# Patient Record
Sex: Male | Born: 2000 | Hispanic: No | Marital: Single | Smoking: Never smoker
Health system: Southern US, Community
[De-identification: ages and names within clinical notes are randomized; demographics above are authoritative.]

## PROBLEM LIST (undated history)

## (undated) DIAGNOSIS — T7840XA Allergy, unspecified, initial encounter: Secondary | ICD-10-CM

## (undated) HISTORY — DX: Allergy, unspecified, initial encounter: T78.40XA

---
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
## 2019-02-21 ENCOUNTER — Emergency Department (HOSPITAL_COMMUNITY)
Admission: EM | Admit: 2019-02-21 | Discharge: 2019-02-21 | Disposition: A | Discharge status: Home / Self Care | Payer: Medicaid Other | Attending: Emergency Medicine | Admitting: Emergency Medicine

## 2019-02-21 ENCOUNTER — Emergency Department (HOSPITAL_COMMUNITY): Payer: Medicaid Other

## 2019-02-21 ENCOUNTER — Other Ambulatory Visit: Payer: Self-pay

## 2019-02-21 ENCOUNTER — Encounter (HOSPITAL_COMMUNITY): Payer: Self-pay | Admitting: *Deleted

## 2019-02-21 DIAGNOSIS — W108XXA Fall (on) (from) other stairs and steps, initial encounter: Secondary | ICD-10-CM | POA: Insufficient documentation

## 2019-02-21 DIAGNOSIS — S4992XA Unspecified injury of left shoulder and upper arm, initial encounter: Secondary | ICD-10-CM | POA: Diagnosis present

## 2019-02-21 DIAGNOSIS — Y929 Unspecified place or not applicable: Secondary | ICD-10-CM | POA: Insufficient documentation

## 2019-02-21 DIAGNOSIS — S43005A Unspecified dislocation of left shoulder joint, initial encounter: Secondary | ICD-10-CM | POA: Diagnosis not present

## 2019-02-21 DIAGNOSIS — Z79899 Other long term (current) drug therapy: Secondary | ICD-10-CM | POA: Insufficient documentation

## 2019-02-21 DIAGNOSIS — Y9389 Activity, other specified: Secondary | ICD-10-CM | POA: Insufficient documentation

## 2019-02-21 DIAGNOSIS — Y999 Unspecified external cause status: Secondary | ICD-10-CM | POA: Diagnosis not present

## 2019-02-21 MED ORDER — KETAMINE HCL 10 MG/ML IJ SOLN
INTRAMUSCULAR | Status: AC | PRN
  Administered 2019-02-21: 50 mg via INTRAVENOUS

## 2019-02-21 MED ORDER — FENTANYL CITRATE (PF) 100 MCG/2ML IJ SOLN
50.0000 ug | Freq: Once | INTRAMUSCULAR | Status: AC
  Administered 2019-02-21: 50 ug via INTRAVENOUS
  Filled 2019-02-21: qty 2

## 2019-02-21 MED ORDER — KETOROLAC TROMETHAMINE 30 MG/ML IJ SOLN
30.0000 mg | Freq: Once | INTRAMUSCULAR | Status: AC
  Administered 2019-02-21: 30 mg via INTRAVENOUS
  Filled 2019-02-21: qty 1

## 2019-02-21 MED ORDER — PROPOFOL 10 MG/ML IV BOLUS
INTRAVENOUS | Status: AC
  Filled 2019-02-21: qty 20

## 2019-02-21 MED ORDER — PROPOFOL 10 MG/ML IV BOLUS
INTRAVENOUS | Status: AC | PRN
  Administered 2019-02-21: 50 mg via INTRAVENOUS

## 2019-02-21 MED ORDER — KETAMINE HCL 50 MG/5ML IJ SOSY
PREFILLED_SYRINGE | INTRAMUSCULAR | Status: AC
  Filled 2019-02-21: qty 5

## 2019-02-21 MED ORDER — OXYCODONE-ACETAMINOPHEN 5-325 MG PO TABS: 2.0000 | ORAL_TABLET | ORAL | 0 refills | Status: AC | PRN

## 2019-02-21 MED ORDER — ETOMIDATE 2 MG/ML IV SOLN
0.1500 mg/kg | Freq: Once | INTRAVENOUS | Status: AC
  Administered 2019-02-21: 15:00:00 12.94 mg via INTRAVENOUS
  Filled 2019-02-21: qty 10

## 2019-02-21 NOTE — ED Notes (Signed)
Patient transported to X-ray 

## 2019-02-21 NOTE — ED Triage Notes (Signed)
Pt reports falling down several stairs this am and now has left shoulder and arm pain, decreased rom.

## 2019-02-21 NOTE — ED Provider Notes (Signed)
[facility] Provider Note   CSN: [id number] Arrival date & time: 02/21/19  1203    History   Chief Complaint Chief Complaint  Patient presents with  . Fall  . Shoulder Pain    HPI Alexander Blankenship is a 18 y.o. male who presents to the ED complaining of sudden onset, constant, severe, 10/10, sharp, left shoulder pain that began around 8 AM this morning. Pt states that he was walking down the stairs when he tripped and attempted to catch himself by the railing. Pt grabbed onto the railing with his left hand but was still falling down the stairs when he felt a pop and had sudden pain in the shoulder. He has not taken anything for the pain; was attempting to deal with the pain on his own but reports it was so severe that he decided to come to the ED. Pt states he is unable to lift his arm at all due to the pain. Denies paresthesias, weakness, numbness.       Past Medical History:  Diagnosis Date  . Allergy     Patient Active Problem List   Diagnosis Date Noted  . Perianal abscess 09/11/2018  . Acne vulgaris 10/28/2014  . 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 OVERWEIGHT 09/25/2008  . DECREASED HEARING 09/26/2007  . ALLERGIC RHINITIS 11/05/2006  . 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 INCISION AND DRAINAGE ABSCESS N/A 09/11/2018   Procedure: INCISION AND DRAINAGE PERIANAL ABSCESS;  Surgeon: Andria MeuseWhite, Christopher M, MD;  Location: [facility] OR;  Service: General;  Laterality: N/A;        Home Medications    Prior to Admission medications   Medication Sig Start Date End Date Taking? Authorizing Provider  busPIRone (BUSPAR) 10 MG tablet Take 10 mg by mouth 2 (two) times a day. 01/01/19 04/01/19 Yes [provider]  FLUoxetine (PROZAC) 20 MG capsule Take 40 mg by mouth daily. 01/01/19  Yes [provider]  oxyCODONE-acetaminophen (PERCOCET/ROXICET) 5-325 MG tablet Take 2 tablets by  mouth every 4 (four) hours as needed for severe pain. 02/21/19   Tanda RockersVenter, Jazmin Ley, PA-C    Family History History reviewed. No pertinent family history.  Social History Social History   Tobacco Use  . Smoking status: Never Smoker  . Smokeless tobacco: Never Used  Substance Use Topics  . Alcohol use: No  . Drug use: No     Allergies   Patient has no known allergies.   Review of Systems Review of Systems  Constitutional: Negative for chills and fever.  HENT: Negative for congestion.   Eyes: Negative for visual disturbance.  Respiratory: Negative for cough.   Cardiovascular: Negative for chest pain.  Gastrointestinal: Negative for abdominal pain.  Genitourinary: Negative for difficulty urinating.  Musculoskeletal: Positive for arthralgias.  Skin: Negative for rash.  Neurological: Negative for weakness and numbness.     Physical Exam Updated Vital Signs BP (!) 152/84 (BP Location: Right Arm)   Pulse 100   Temp 98.3 F (36.8 C) (Oral)   Resp 18   SpO2 100%   Physical Exam Vitals signs and nursing note reviewed.  Constitutional:      Appearance: He is not ill-appearing.  HENT:     Head: Normocephalic and atraumatic.  Eyes:     Conjunctiva/sclera: Conjunctivae normal.  Neck:     Musculoskeletal: Neck supple.  Cardiovascular:     Rate and Rhythm: Normal rate and regular rhythm.  Pulmonary:  Effort: Pulmonary effort is normal.     Breath sounds: Normal breath sounds.  Abdominal:     Palpations: Abdomen is soft.     Tenderness: There is no abdominal tenderness.  Musculoskeletal:     Comments: Obvious deformity to left shoulder with exquisite TTP; mild tenderness to left humerus and left forearm although pt reports radiating pain from the shoulder; 2+ radial pulse; able to wiggle fingers; grip strength 4/5  Skin:    General: Skin is warm and dry.  Neurological:     Mental Status: He is alert.      ED Treatments / Results  Labs (all labs ordered are  listed, but only abnormal results are displayed) Labs Reviewed - No data to display  EKG None  Radiology Dg Shoulder Left  Result Date: 02/21/2019 CLINICAL DATA:  Left shoulder injury due to fall down stairs today. Initial encounter. EXAM: LEFT SHOULDER - 2+ VIEW COMPARISON:  None. FINDINGS: The left shoulder is anteriorly dislocated. Fracture. The acromioclavicular joint appears normal. Imaged left lung ribs appear normal. IMPRESSION: Anterior left shoulder dislocation. Electronically Signed   By: Inge Rise M.D.   On: 02/21/2019 12:56   Dg Shoulder Left Portable  Result Date: 02/21/2019 CLINICAL DATA:  Status post reduction. EXAM: LEFT SHOULDER - 1 VIEW COMPARISON:  February 21, 2019 FINDINGS: There has been interval reduction of the previously demonstrated glenohumeral dislocation. There may be a slight irregularity of the humeral head. The inferior glenoid appears grossly unremarkable. IMPRESSION: 1. Interval reduction of the previously demonstrated glenohumeral dislocation. 2. 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 3. No definite fracture involving the inferior glenoid. Electronically Signed   By: Constance Holster M.D.   On: 02/21/2019 15:47    Procedures Procedures (including critical care time)  Medications Ordered in ED Medications  fentaNYL (SUBLIMAZE) injection 50 mcg (50 mcg Intravenous Given 02/21/19 1324)  etomidate (AMIDATE) injection 12.94 mg (12.94 mg Intravenous Given 02/21/19 1453)  propofol (DIPRIVAN) 10 mg/mL bolus/IV push (50 mg Intravenous Given 02/21/19 1505)  ketorolac (TORADOL) 30 MG/ML injection 30 mg (30 mg Intravenous Given 02/21/19 1543)  ketamine (KETALAR) injection (50 mg Intravenous Given 02/21/19 1506)     Initial Impression / Assessment and Plan / ED Course  I have reviewed the triage vital signs and the nursing notes.  Pertinent labs & imaging results that were available during my care of the patient were  reviewed by me and considered in my medical decision making (see chart for details).    18 year old male presenting to the ED with left shoulder pain after tripping and falling down stairs and attempted to hold onto the railing. Obvious deformity to left shoulder; suspect dislocation at this time. Xray obtained; per my reading the shoulder appears to be anteriorly dislocated; agree with radiologist reading. Offered reduction without conscious sedation per the [facility] Method but patient reports his pain is too severe. Will proceed with conscious sedation at this time; Attending physician Dr. Eulis Foster made aware. Fentanyl given for pain.   Conscious sedation attempted with Etomidate without success; used propofol and ketamine instead; Dr. Eulis Foster able to reduce shoulder back into place. Please see separate procedure note. Patient placed in a shoulder immobilizer; repeat xray shows appropriate reduction with Hill Sachs deformity. Will have patient follow up with orthopedics in the next 1 week. Pt given short course of pain medication as well; advised to take Ibuprofen and if no relief to take short course of Percocet prescribed. Pt  is in agreement with plan at this time and stable for discharge home.        Final Clinical Impressions(s) / ED Diagnoses   Final diagnoses:  Dislocation of left shoulder joint, initial encounter    ED Discharge Orders         Ordered    oxyCODONE-acetaminophen (PERCOCET/ROXICET) 5-325 MG tablet  Every 4 hours PRN     02/21/19 1614           Tanda RockersVenter, Darene Nappi, PA-C 02/21/19 2138    Mancel BaleWentz, Elliott, MD 02/24/19 1337

## 2019-02-21 NOTE — ED Notes (Signed)
Luiz Ochoa (Brother): [phone] Pts brother would like an update when possible.

## 2019-02-24 NOTE — ED Provider Notes (Signed)
  Face-to-face evaluation   History: Patient going down steps, and misstepped, catching himself with his left hand, and feeling a pull in his left shoulder.  No other injuries.  Physical exam: Left shoulder deformed with subacromial defect.  Neurovascular intact distally in the left hand and intact deltoid sensation.    .Sedation  Date/Time: 02/24/2019 1:21 PM Performed by: Daleen Bo, MD Authorized by: Daleen Bo, MD   Consent:    Consent obtained:  Verbal   Consent given by:  Patient   Risks discussed:  Inadequate sedation, nausea, vomiting, prolonged hypoxia resulting in organ damage and respiratory compromise necessitating ventilatory assistance and intubation Universal protocol:    Immediately prior to procedure a time out was called: yes   Indications:    Procedure performed:  Dislocation reduction   Procedure necessitating sedation performed by:  Physician performing sedation Pre-sedation assessment:    Time since last food or drink:  4 hours   ASA classification: class 1 - normal, healthy patient     Neck mobility: normal     Mouth opening:  3 or more finger widths   Mallampati score:  II - soft palate, uvula, fauces visible   Pre-sedation assessments completed and reviewed: airway patency, cardiovascular function, hydration status, mental status, pain level and respiratory function     Pre-sedation assessments completed and reviewed: nausea/vomiting not reviewed   Immediate pre-procedure details:    Reassessment: Patient reassessed immediately prior to procedure     Reviewed: vital signs     Verified: bag valve mask available, emergency equipment available, intubation equipment available, IV patency confirmed, oxygen available and suction available   Procedure details (see MAR for exact dosages):    Preoxygenation:  Room air   Sedation:  Etomidate, ketamine and propofol   Analgesia:  Fentanyl   Intra-procedure monitoring:  Blood pressure monitoring, cardiac  monitor, continuous capnometry, continuous pulse oximetry, frequent LOC assessments and frequent vital sign checks   Intra-procedure events: none     Total Provider sedation time (minutes):  25 Post-procedure details:    Attendance: Constant attendance by certified staff until patient recovered     Recovery: Patient returned to pre-procedure baseline     Post-sedation assessments completed and reviewed: airway patency, cardiovascular function, hydration status, mental status, nausea/vomiting, pain level and respiratory function     Patient is stable for discharge or admission: yes     Patient tolerance:  Tolerated well, no immediate complications .Ortho Injury Treatment  Date/Time: 02/24/2019 1:24 PM Performed by: Daleen Bo, MD Authorized by: Daleen Bo, MD   Consent:    Consent obtained:  Verbal   Risks discussed:  Vascular damage, stiffness and recurrent dislocation   Alternatives discussed:  No treatmentInjury location: shoulder Location details: left shoulder Injury type: dislocation Dislocation type: anterior Hill-Sachs deformity: yes Chronicity: new Pre-procedure neurovascular assessment: neurovascularly intact Pre-procedure distal perfusion: normal Pre-procedure neurological function: normal Pre-procedure range of motion: normal  Anesthesia: Local anesthesia used: no  Patient sedated: Yes. Refer to sedation procedure documentation for details of sedation. Manipulation performed: yes Reduction method: external rotation (Flexion and adduction) Reduction successful: yes X-ray confirmed reduction: yes Immobilization: sling Post-procedure neurovascular assessment: post-procedure neurovascularly intact Post-procedure distal perfusion: normal Post-procedure neurological function: normal Post-procedure range of motion: normal     Medical screening examination/treatment/procedure(s) were conducted as a shared visit with non-physician practitioner(s) and myself.  I  personally evaluated the patient during the encounter    Daleen Bo, MD 02/24/19 1336

## 2019-07-26 IMAGING — CT CT PELVIS W/ CM
2 of 3 series · 16 of 46 positions shown, 18 images · IV contrast (APPLIED)
Comparison: None.

CLINICAL DATA: Evaluate perirectal abscess.  Rectal pain.

EXAM:
CT PELVIS WITH CONTRAST
TECHNIQUE: Multidetector CT imaging of the pelvis was performed using the
standard protocol following the bolus administration of intravenous
contrast.
CONTRAST:  100mL OMNIPAQUE IOHEXOL 300 MG/ML  SOLN

[Series 3: pelvis 5.0 i30f 2 · axial · 0.72mm/px · z∈[+845,+1100]mm · 13 of 59 slices shown, 15 images]
[im 4/59  soft-tissue]
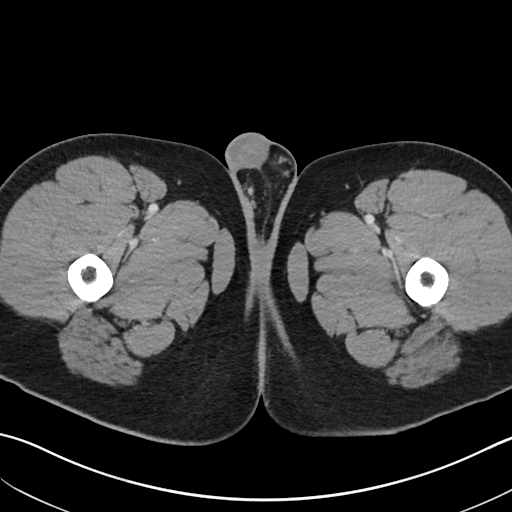
[im 4/59  bone]
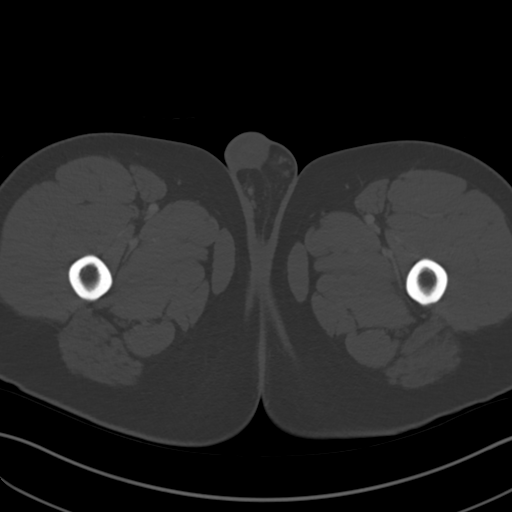
[im 8/59  soft-tissue]
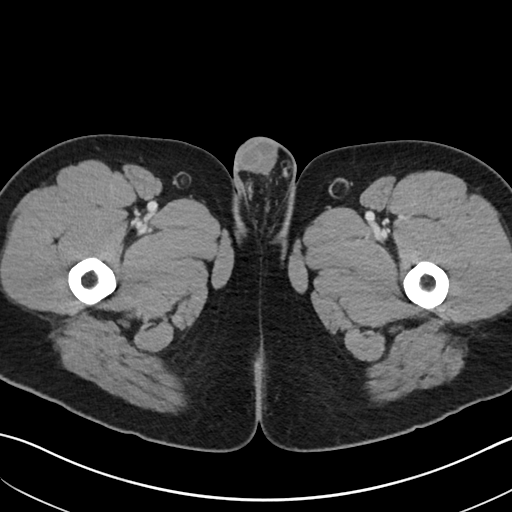
[im 12/59  soft-tissue]
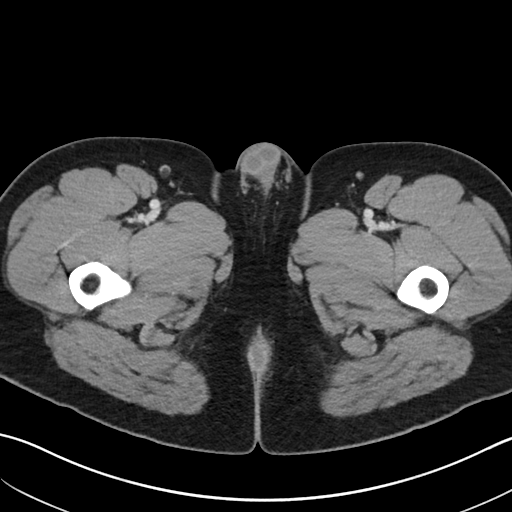
[im 17/59  soft-tissue]
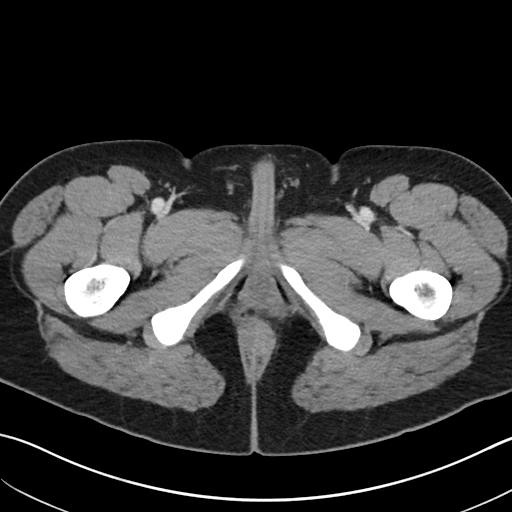
[im 21/59  soft-tissue]
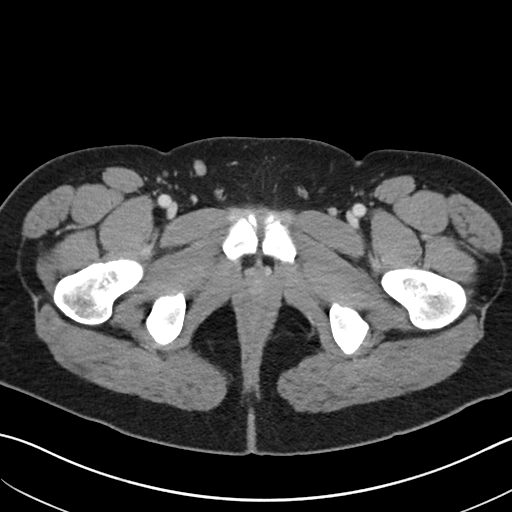
[im 25/59  soft-tissue]
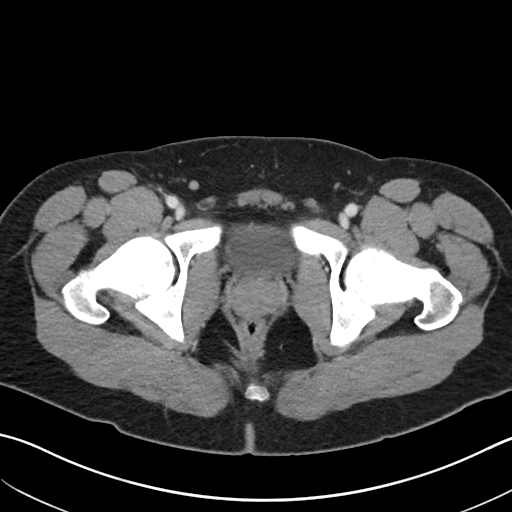
[im 30/59  soft-tissue]
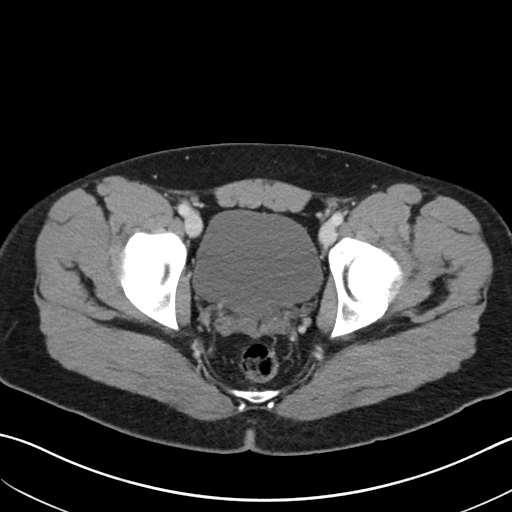
[im 34/59  soft-tissue]
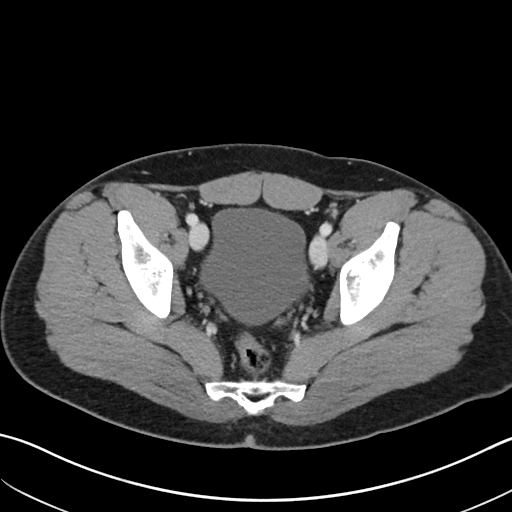
[im 38/59  soft-tissue]
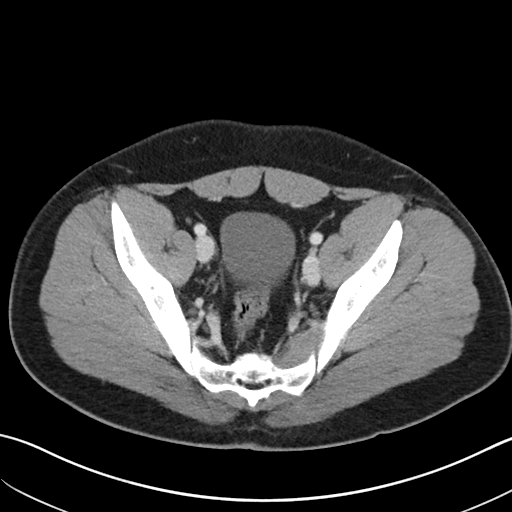
[im 38/59  bone]
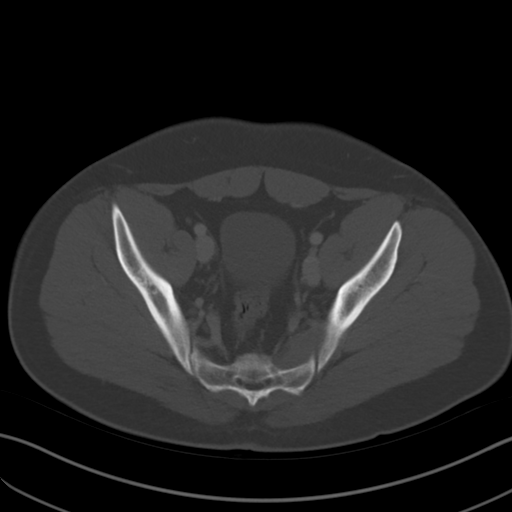
[im 42/59  soft-tissue]
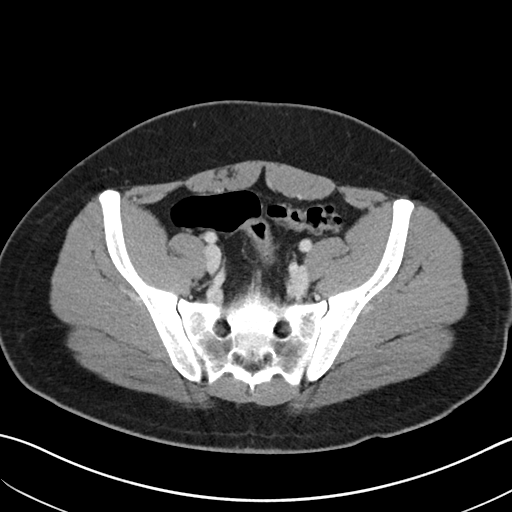
[im 47/59  soft-tissue]
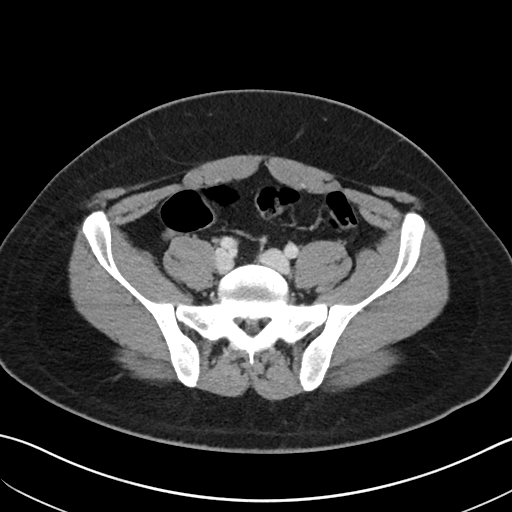
[im 51/59  soft-tissue]
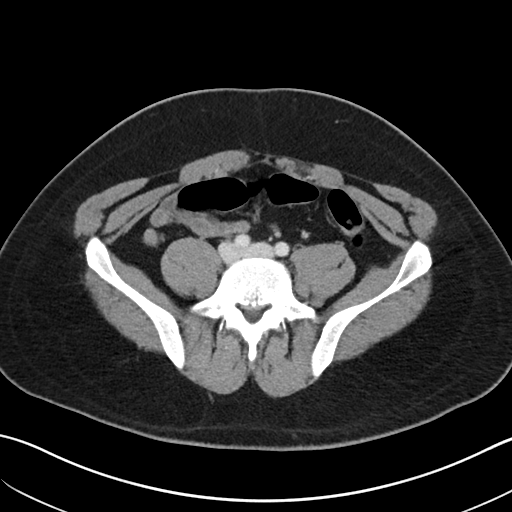
[im 55/59  soft-tissue]
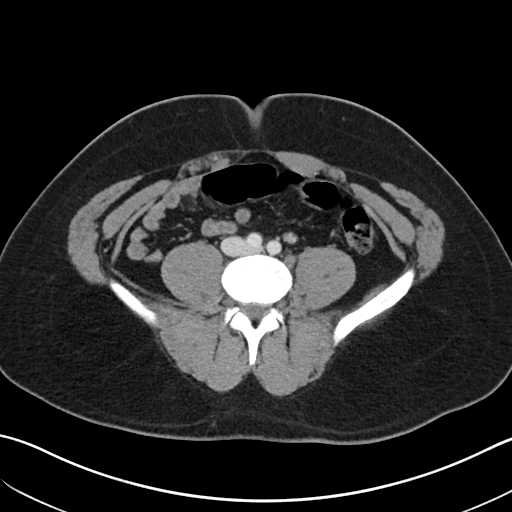

[Series 5: coronal soft tissue · coronal · 0.57mm/px · 3 of 95 slices shown]
[im 32/95  soft-tissue]
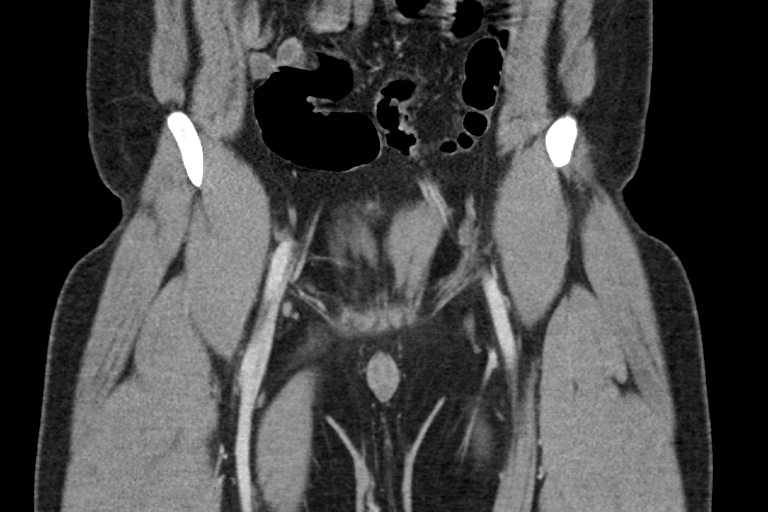
[im 42/95  soft-tissue]
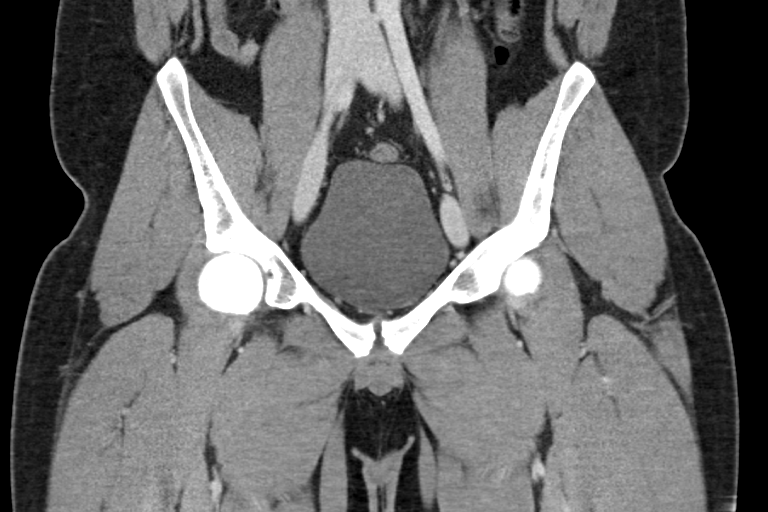
[im 53/95  soft-tissue]
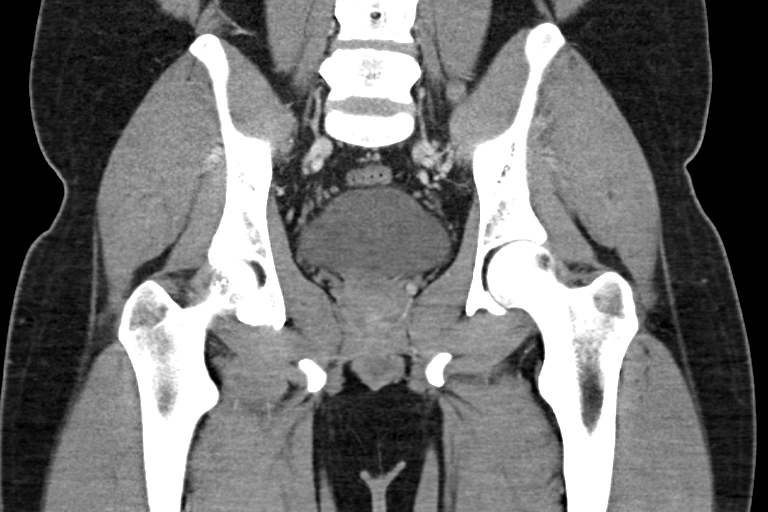

[16 of 46 positions shown; findings below may reference images not displayed]

FINDINGS: Urinary Tract: The bladder is unremarkable. No bladder mass,
asymmetric bladder wall thickening or bladder calculi.

Bowel: The visualized small bowel and colon are unremarkable. No
rectal mass or rectal inflammatory process. There is however a
perianal fistula with an abscess extending down to the perineum.
This measures approximately 3.4 x 2.1 x 1.5 cm. Recommend MR imaging
(MRI pelvis without and with contrast) for better characterization
and classification of the fistula.

Vascular/Lymphatic: The vascular structures are normal. No pelvic or
inguinal lymphadenopathy.

Reproductive:  The prostate gland and seminal vesicles are normal.

Other:  No free pelvic fluid collections or intrapelvic abscess.

Musculoskeletal: No significant bony findings.
IMPRESSION: 1. Perianal fistula and associated abscess extending down to the
perineum. MRI imaging without and with contrast may be helpful for
better definition and classification of the fistula.
2. No significant intrapelvic abnormalities.

## 2020-01-05 IMAGING — DX LEFT SHOULDER - 1 VIEW
1 series · 2 of 2 positions shown · non-contrast
Comparison: February 21, 2019

CLINICAL DATA: Status post reduction.

EXAM:
LEFT SHOULDER - 1 VIEW

[Series 1: shoulder · 0.14mm/px · 2 of 2 slices shown]
[im 1/2]
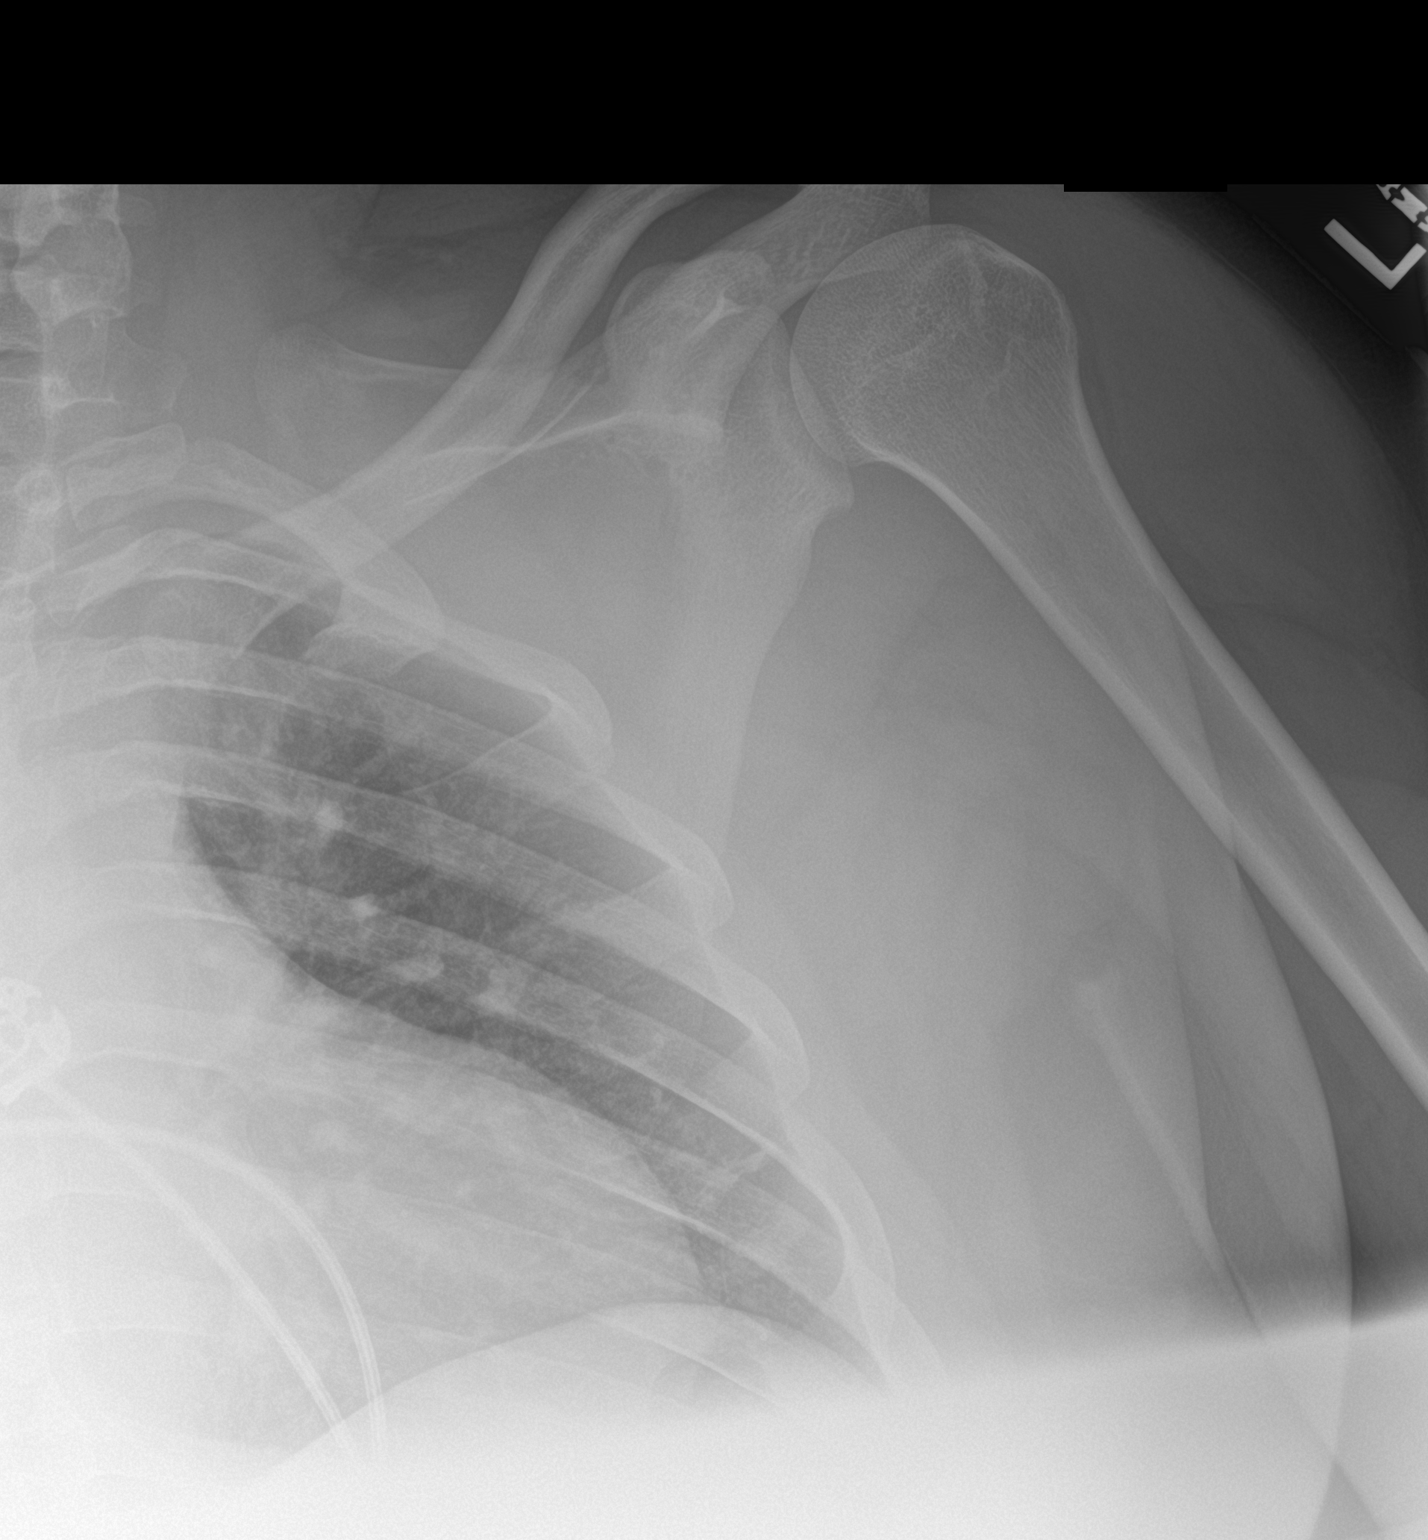
[im 2/2]
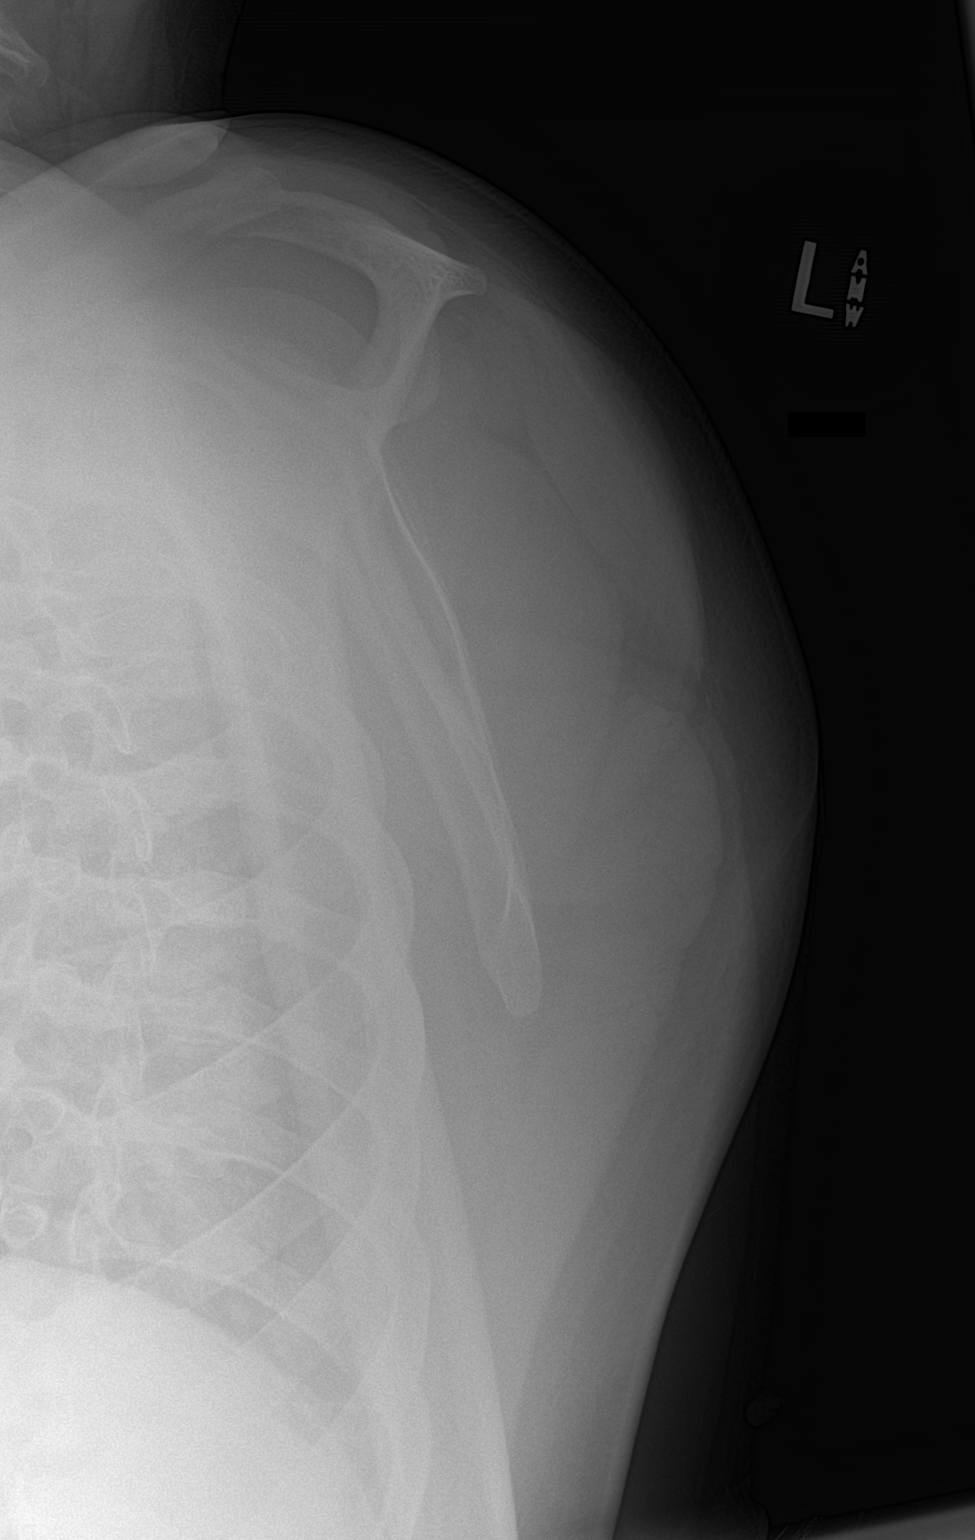

[2 of 2 positions shown; findings below may reference images not displayed]

FINDINGS: There has been interval reduction of the previously demonstrated
glenohumeral dislocation. There may be a slight irregularity of the
humeral head. The inferior glenoid appears grossly unremarkable.
IMPRESSION: 1. Interval reduction of the previously demonstrated glenohumeral
dislocation.
2. Slight irregularity of the humeral head suggestive of underlying
Hill-Sachs lesion.
3. No definite fracture involving the inferior glenoid.

## 2020-01-05 IMAGING — DX LEFT SHOULDER - 2+ VIEW
2 series · 2 of 2 positions shown · non-contrast
Comparison: None.

CLINICAL DATA: Left shoulder injury due to fall down stairs today.
Initial encounter.

EXAM:
LEFT SHOULDER - 2+ VIEW

[w shoulder internal left]
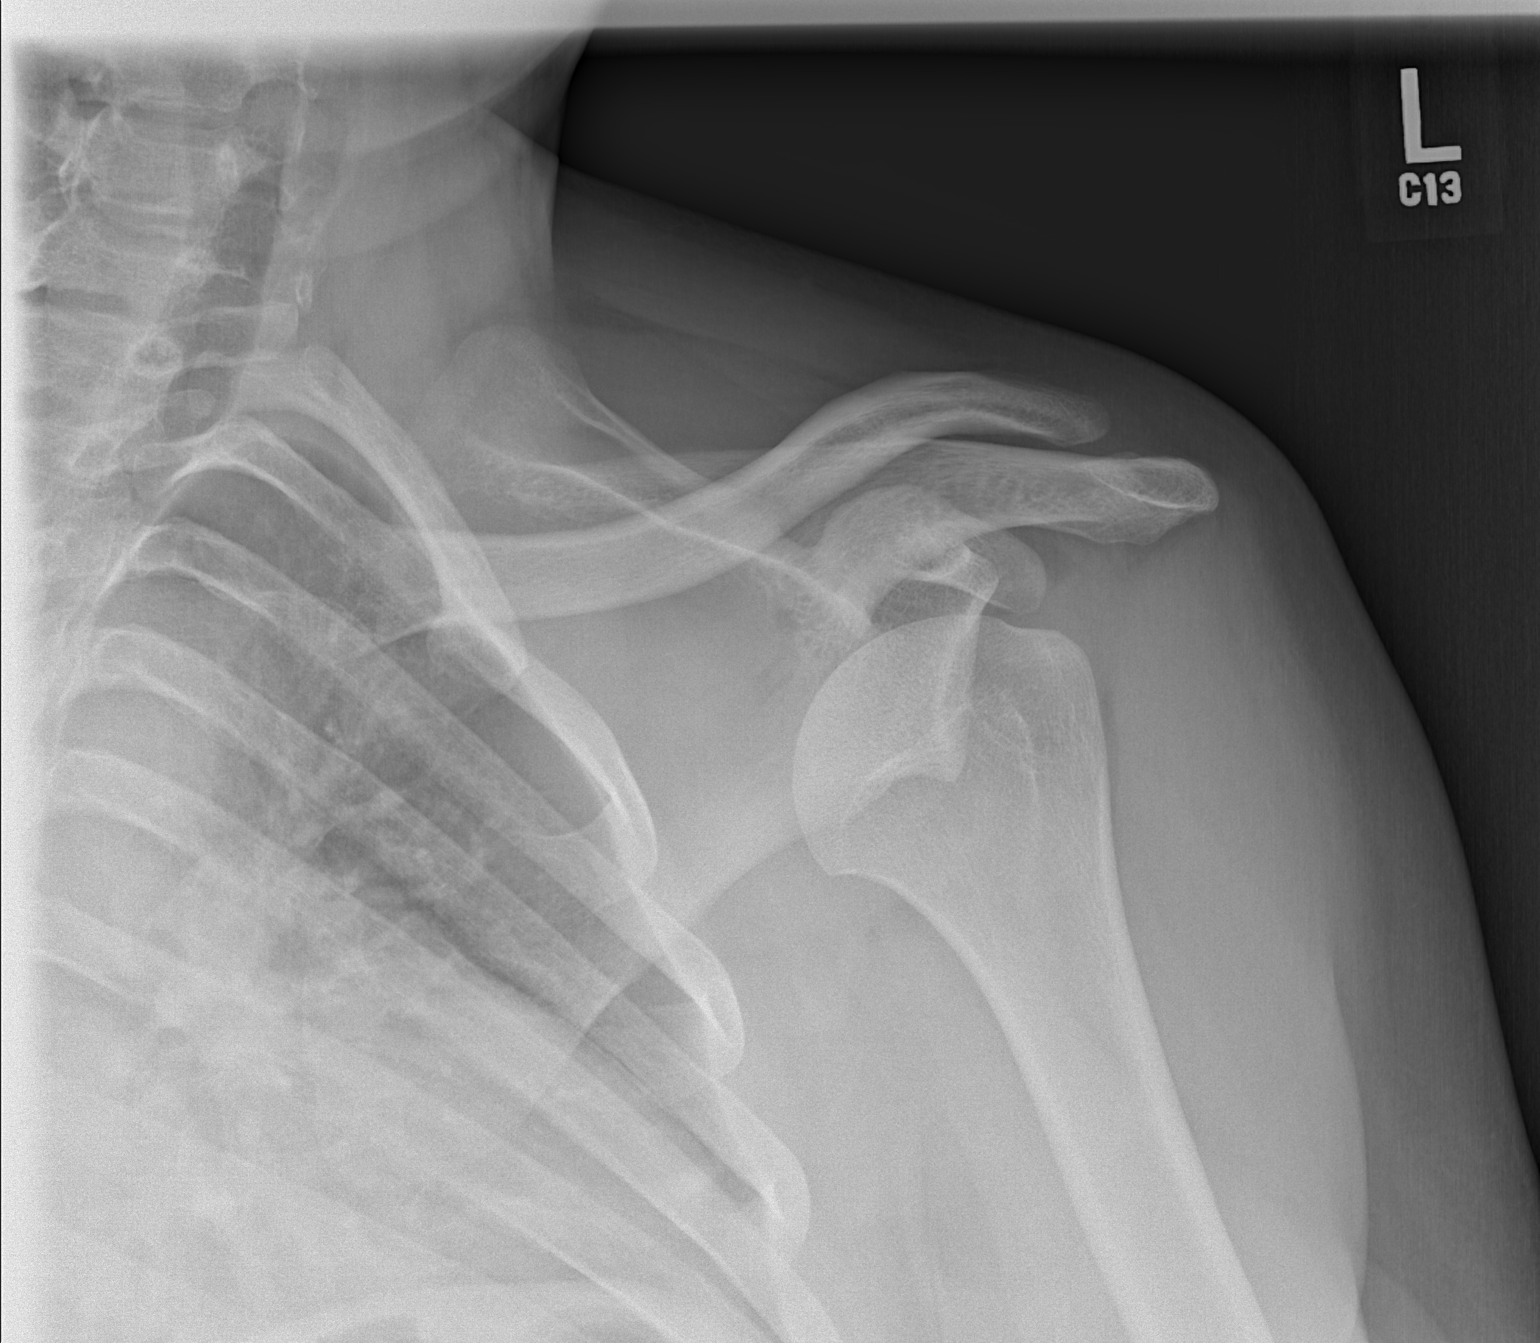

[w shoulder y-view left]
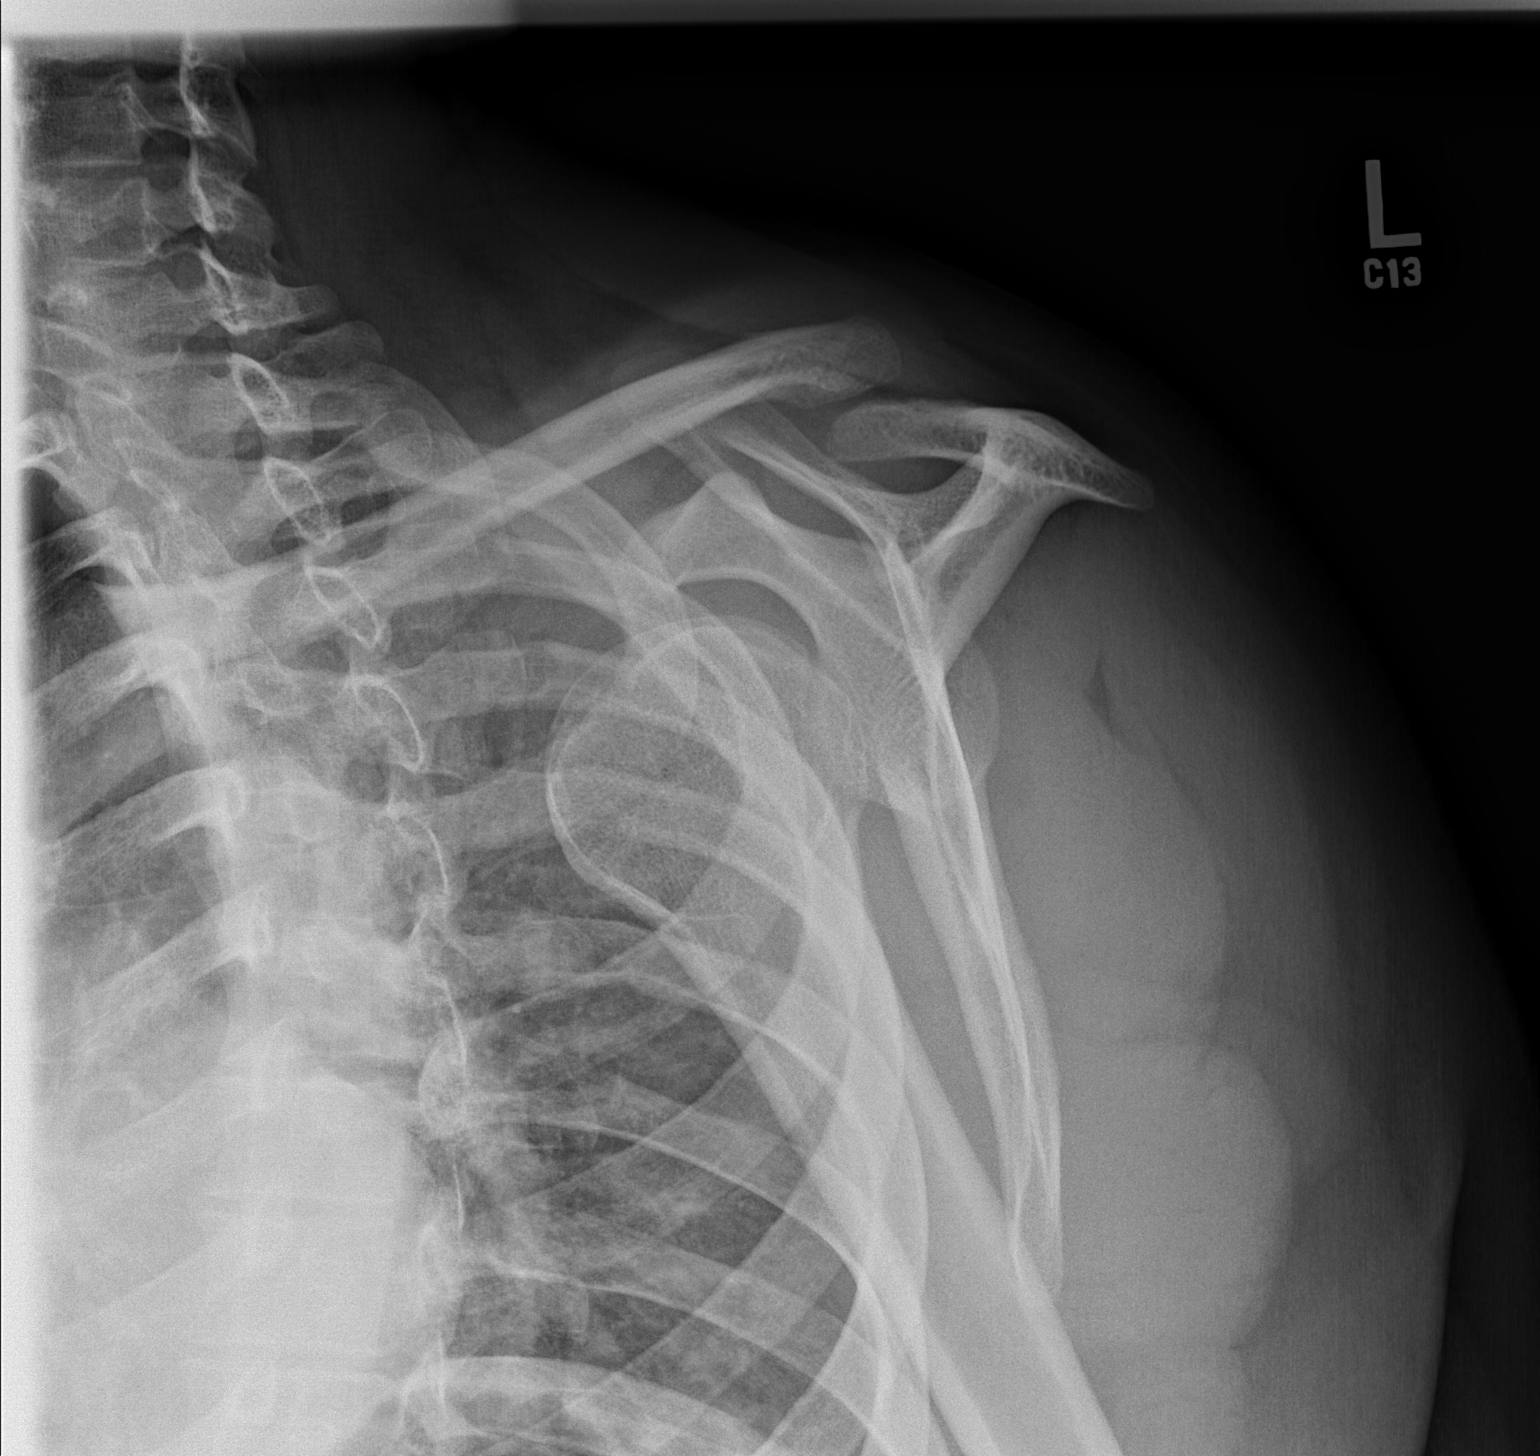

[2 of 2 positions shown; findings below may reference images not displayed]

FINDINGS: The left shoulder is anteriorly dislocated. Fracture. The
acromioclavicular joint appears normal. Imaged left lung ribs appear
normal.
IMPRESSION: Anterior left shoulder dislocation.

## 2020-12-26 ENCOUNTER — Emergency Department (HOSPITAL_COMMUNITY)
Admission: EM | Admit: 2020-12-26 | Discharge: 2020-12-26 | Disposition: A | Discharge status: Home / Self Care | Payer: Medicaid Other | Attending: Emergency Medicine | Admitting: Emergency Medicine

## 2020-12-26 ENCOUNTER — Other Ambulatory Visit: Payer: Self-pay

## 2020-12-26 DIAGNOSIS — S51052A Open bite, left elbow, initial encounter: Secondary | ICD-10-CM | POA: Insufficient documentation

## 2020-12-26 DIAGNOSIS — Z2914 Encounter for prophylactic rabies immune globin: Secondary | ICD-10-CM | POA: Insufficient documentation

## 2020-12-26 DIAGNOSIS — Z23 Encounter for immunization: Secondary | ICD-10-CM | POA: Insufficient documentation

## 2020-12-26 DIAGNOSIS — W540XXA Bitten by dog, initial encounter: Secondary | ICD-10-CM | POA: Insufficient documentation

## 2020-12-26 MED ORDER — RABIES IMMUNE GLOBULIN 150 UNIT/ML IM INJ
20.0000 [IU]/kg | INJECTION | Freq: Once | INTRAMUSCULAR | Status: AC
  Administered 2020-12-26: 1425 [IU] via INTRAMUSCULAR
  Filled 2020-12-26: qty 10

## 2020-12-26 MED ORDER — OXYCODONE-ACETAMINOPHEN 5-325 MG PO TABS
2.0000 | ORAL_TABLET | Freq: Once | ORAL | Status: AC
  Administered 2020-12-26: 2 via ORAL
  Filled 2020-12-26: qty 2

## 2020-12-26 MED ORDER — TETANUS-DIPHTH-ACELL PERTUSSIS 5-2.5-18.5 LF-MCG/0.5 IM SUSY
0.5000 mL | PREFILLED_SYRINGE | Freq: Once | INTRAMUSCULAR | Status: AC
  Administered 2020-12-26: 0.5 mL via INTRAMUSCULAR
  Filled 2020-12-26: qty 0.5

## 2020-12-26 MED ORDER — AMOXICILLIN-POT CLAVULANATE 875-125 MG PO TABS: 1.0000 | ORAL_TABLET | Freq: Two times a day (BID) | ORAL | 0 refills | Status: AC

## 2020-12-26 MED ORDER — RABIES VACCINE, PCEC IM SUSR
1.0000 mL | Freq: Once | INTRAMUSCULAR | Status: AC
  Administered 2020-12-26: 1 mL via INTRAMUSCULAR
  Filled 2020-12-26: qty 1

## 2020-12-26 MED ORDER — AMOXICILLIN-POT CLAVULANATE 875-125 MG PO TABS
1.0000 | ORAL_TABLET | Freq: Once | ORAL | Status: AC
  Administered 2020-12-26: 1 via ORAL
  Filled 2020-12-26: qty 1

## 2020-12-26 NOTE — ED Provider Notes (Signed)
MSE was initiated and I personally evaluated the patient and placed orders (if any) at  12:55 AM on Dec 26, 2020.  Patient here with CC of animal bite.  States that he was bitten by a dog.  He doesn't know who the dog belongs to or where it lives or its rabies status.  He complains of pain and tingling at the sites of the bite.  He has cleaned and dressed his wounds.  ROS: As listed above  PE: Alert and oriented Answers questions appropriately No respiratory distress Puncture wounds to left forearm, scrapes on right forearm.  As there is no way of knowing the dog's rabies status or monitoring, will give rabies series.  Discussed with patient that their care has been initiated.   They are counseled that they will need to remain in the ED until the completion of their workup, including full H&P and results of any tests.  Risks of leaving the emergency department prior to completion of treatment were discussed. Patient was advised to inform ED staff if they are leaving before their treatment is complete. The patient acknowledged these risks and time was allowed for questions.    The patient appears stable so that the remainder of the MSE may be completed by another provider.    Roxy Horseman, PA-C 12/26/20 5643    Gilda Crease, MD 12/26/20 [phone]

## 2020-12-26 NOTE — ED Triage Notes (Addendum)
Pt states he was cleaning up outside after a party when a dog that he does not know charged at him. Pt tried to stop dog with left arm out and dog bit his arms.  Pt has puncture wounds on left elbow, left hand and abrasions on right arm. Bleeding is controlled.

## 2020-12-26 NOTE — Discharge Instructions (Signed)
You will need additional vaccination doses on Wednesday, May 18, Sunday, May 22 and Sunday, May 29.

## 2020-12-26 NOTE — ED Provider Notes (Signed)
[facility] EMERGENCY DEPARTMENT Provider Note   CSN: [id number] Arrival date & time: 12/26/20  0041     History Chief Complaint  Patient presents with  . Animal Bite    Alexander Blankenship is a 20 y.o. male.  Patient presents to the emergency department for evaluation after being bitten by a stray dog.  Patient with wounds on the left elbow and left hand.  Patient's tetanus is up-to-date.  Status of dog's vaccination is unknown.  Dog is not contained.        Past Medical History:  Diagnosis Date  . Allergy     Patient Active Problem List   Diagnosis Date Noted  . Perianal abscess 09/11/2018  . Acne vulgaris 10/28/2014  . 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 OVERWEIGHT 09/25/2008  . DECREASED HEARING 09/26/2007  . ALLERGIC RHINITIS 11/05/2006  . 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 INCISION AND DRAINAGE ABSCESS N/A 09/11/2018   Procedure: INCISION AND DRAINAGE PERIANAL ABSCESS;  Surgeon: Andria Meuse, MD;  Location: [facility] OR;  Service: General;  Laterality: N/A;       No family history on file.  Social History   Tobacco Use  . Smoking status: Never Smoker  . Smokeless tobacco: Never Used  Substance Use Topics  . Alcohol use: No  . Drug use: No    Home Medications Prior to Admission medications   Medication Sig Start Date End Date Taking? Authorizing Provider  amoxicillin-clavulanate (AUGMENTIN) 875-125 MG tablet Take 1 tablet by mouth every 12 (twelve) hours. 12/26/20  Yes Clarivel Callaway, Canary Brim, MD  FLUoxetine (PROZAC) 20 MG capsule Take 40 mg by mouth daily. 01/01/19   [provider]  oxyCODONE-acetaminophen (PERCOCET/ROXICET) 5-325 MG tablet Take 2 tablets by mouth every 4 (four) hours as needed for severe pain. 02/21/19   Tanda Rockers, PA-C    Allergies    Patient has no known allergies.  Review of Systems   Review of Systems  Skin: Positive for wound.  All  other systems reviewed and are negative.   Physical Exam Updated Vital Signs BP 113/67 (BP Location: Right Arm)   Pulse 75   Temp 98.7 F (37.1 C) (Oral)   Resp 13   Ht 5\' 6"  (1.676 m)   Wt 74.4 kg   SpO2 100%   BMI 26.47 kg/m   Physical Exam Vitals and nursing note reviewed.  Constitutional:      Appearance: Normal appearance.  HENT:     Head: Atraumatic.  Musculoskeletal:     Left elbow: Laceration present. No swelling or deformity. Normal range of motion.     Left hand: Swelling and tenderness present. No deformity. Normal range of motion.  Skin:    Comments: Puncture wound over lateral and medial epicondyle area of left elbow.  Multiple abrasions right hand, punctures over dorsal aspect of left hand  Neurological:     Mental Status: He is alert.     Sensory: Sensation is intact.     Motor: Motor function is intact.     ED Results / Procedures / Treatments   Labs (all labs ordered are listed, but only abnormal results are displayed) Labs Reviewed - No data to display  EKG None  Radiology No results found.  Procedures Procedures   Medications Ordered in ED Medications  Tdap (BOOSTRIX) injection 0.5 mL (has no administration in time range)  amoxicillin-clavulanate (AUGMENTIN) 875-125 MG per tablet 1 tablet (1 tablet Oral Given 12/26/20  0105)  oxyCODONE-acetaminophen (PERCOCET/ROXICET) 5-325 MG per tablet 2 tablet (2 tablets Oral Given 12/26/20 0105)  rabies immune globulin (HYPERAB/KEDRAB) injection 1,425 Units (1,425 Units Intramuscular Given 12/26/20 0405)  rabies vaccine (RABAVERT) injection 1 mL (1 mL Intramuscular Given 12/26/20 0422)    ED Course  I have reviewed the triage vital signs and the nursing notes.  Pertinent labs & imaging results that were available during my care of the patient were reviewed by me and considered in my medical decision making (see chart for details).    MDM Rules/Calculators/A&P                          Patient bitten  by stray dog that is not able to be quarantined.  Administered immunoglobulin and first vaccination administration today.  Final Clinical Impression(s) / ED Diagnoses Final diagnoses:  Dog bite, initial encounter    Rx / DC Orders ED Discharge Orders         Ordered    amoxicillin-clavulanate (AUGMENTIN) 875-125 MG tablet  Every 12 hours        12/26/20 0454           Gilda Crease, MD 12/26/20 [phone]

## 2020-12-29 ENCOUNTER — Ambulatory Visit (HOSPITAL_COMMUNITY)
Admission: EM | Admit: 2020-12-29 | Discharge: 2020-12-29 | Disposition: A | Discharge status: Home / Self Care | Payer: Medicaid Other | Attending: Family Medicine | Admitting: Family Medicine

## 2020-12-29 ENCOUNTER — Other Ambulatory Visit: Payer: Self-pay

## 2020-12-29 DIAGNOSIS — Z203 Contact with and (suspected) exposure to rabies: Secondary | ICD-10-CM | POA: Diagnosis not present

## 2020-12-29 MED ORDER — RABIES VACCINE, PCEC IM SUSR
INTRAMUSCULAR | Status: AC
  Filled 2020-12-29: qty 1

## 2020-12-29 MED ORDER — RABIES VACCINE, PCEC IM SUSR
1.0000 mL | Freq: Once | INTRAMUSCULAR | Status: AC
  Administered 2020-12-29: 1 mL via INTRAMUSCULAR

## 2020-12-29 NOTE — ED Triage Notes (Signed)
Pt is here today for rabies vaccine. He states he received the first series at ER.

## 2021-01-02 ENCOUNTER — Ambulatory Visit (HOSPITAL_COMMUNITY)
Admission: EM | Admit: 2021-01-02 | Discharge: 2021-01-02 | Disposition: A | Discharge status: Home / Self Care | Payer: Medicaid Other | Attending: Internal Medicine | Admitting: Internal Medicine

## 2021-01-02 ENCOUNTER — Other Ambulatory Visit: Payer: Self-pay

## 2021-01-02 DIAGNOSIS — Z203 Contact with and (suspected) exposure to rabies: Secondary | ICD-10-CM | POA: Diagnosis not present

## 2021-01-02 MED ORDER — RABIES VACCINE, PCEC IM SUSR
1.0000 mL | Freq: Once | INTRAMUSCULAR | Status: AC
  Administered 2021-01-02: 1 mL via INTRAMUSCULAR

## 2021-01-02 MED ORDER — RABIES VACCINE, PCEC IM SUSR
INTRAMUSCULAR | Status: AC
  Filled 2021-01-02: qty 1

## 2021-01-02 NOTE — ED Triage Notes (Signed)
Pt is here today for rabies vaccine. It will be placed in his left arm.

## 2021-01-09 ENCOUNTER — Ambulatory Visit (HOSPITAL_COMMUNITY)
Admission: EM | Admit: 2021-01-09 | Discharge: 2021-01-09 | Disposition: A | Discharge status: Home / Self Care | Payer: Medicaid Other | Attending: Internal Medicine | Admitting: Internal Medicine

## 2021-01-09 DIAGNOSIS — Z203 Contact with and (suspected) exposure to rabies: Secondary | ICD-10-CM | POA: Diagnosis not present

## 2021-01-09 MED ORDER — RABIES VACCINE, PCEC IM SUSR
INTRAMUSCULAR | Status: AC
  Filled 2021-01-09: qty 1

## 2021-01-09 MED ORDER — RABIES VACCINE, PCEC IM SUSR
1.0000 mL | Freq: Once | INTRAMUSCULAR | Status: AC
  Administered 2021-01-09: 1 mL via INTRAMUSCULAR

## 2021-01-09 NOTE — ED Triage Notes (Signed)
Pt is her today to receive his fourth rabies vaccine. It was place in right deltoid

## 2021-02-02 ENCOUNTER — Emergency Department (HOSPITAL_COMMUNITY)
Admission: EM | Admit: 2021-02-02 | Discharge: 2021-02-02 | Disposition: A | Discharge status: Home / Self Care | Payer: Medicaid Other | Attending: Emergency Medicine | Admitting: Emergency Medicine

## 2021-02-02 ENCOUNTER — Emergency Department (HOSPITAL_COMMUNITY): Payer: Medicaid Other

## 2021-02-02 ENCOUNTER — Other Ambulatory Visit: Payer: Self-pay

## 2021-02-02 DIAGNOSIS — S43015A Anterior dislocation of left humerus, initial encounter: Secondary | ICD-10-CM

## 2021-02-02 DIAGNOSIS — X501XXA Overexertion from prolonged static or awkward postures, initial encounter: Secondary | ICD-10-CM | POA: Diagnosis not present

## 2021-02-02 DIAGNOSIS — S4992XA Unspecified injury of left shoulder and upper arm, initial encounter: Secondary | ICD-10-CM | POA: Diagnosis present

## 2021-02-02 MED ORDER — FENTANYL CITRATE (PF) 100 MCG/2ML IJ SOLN
INTRAMUSCULAR | Status: AC
  Administered 2021-02-02: 100 ug via INTRAVENOUS
  Filled 2021-02-02: qty 2

## 2021-02-02 MED ORDER — FENTANYL CITRATE (PF) 100 MCG/2ML IJ SOLN: 50.0000 ug | Freq: Once | INTRAMUSCULAR | Status: DC

## 2021-02-02 MED ORDER — PROPOFOL 10 MG/ML IV BOLUS
0.5000 mg/kg | Freq: Once | INTRAVENOUS | Status: DC
  Filled 2021-02-02: qty 20

## 2021-02-02 MED ORDER — PROPOFOL 10 MG/ML IV BOLUS
INTRAVENOUS | Status: AC | PRN
  Administered 2021-02-02: 30 mg via INTRAVENOUS
  Administered 2021-02-02: 70 mg via INTRAVENOUS

## 2021-02-02 MED ORDER — LIDOCAINE HCL (PF) 1 % IJ SOLN
30.0000 mL | Freq: Once | INTRAMUSCULAR | Status: AC
  Administered 2021-02-02: 30 mL
  Filled 2021-02-02: qty 30

## 2021-02-02 MED ORDER — FENTANYL CITRATE (PF) 100 MCG/2ML IJ SOLN
100.0000 ug | Freq: Once | INTRAMUSCULAR | Status: AC
  Administered 2021-02-02: 100 ug via INTRAVENOUS
  Filled 2021-02-02: qty 2

## 2021-02-02 MED ORDER — OXYCODONE-ACETAMINOPHEN 5-325 MG PO TABS
1.0000 | ORAL_TABLET | Freq: Once | ORAL | Status: AC
  Administered 2021-02-02: 1 via ORAL
  Filled 2021-02-02: qty 1

## 2021-02-02 MED ORDER — FENTANYL CITRATE (PF) 100 MCG/2ML IJ SOLN: 100.0000 ug | Freq: Once | INTRAMUSCULAR | Status: AC

## 2021-02-02 NOTE — ED Provider Notes (Signed)
Emergency Medicine Provider Triage Evaluation Note  Alexander Blankenship , a 20 y.o. male  was evaluated in triage.  Pt complains of left shoulder pain.  Patient with history of anterior shoulder dislocation presenting after he heard a pop while reaching overhead with left arm.  Suspects that he has once again dislocated his left shoulder.  Pain is currently 6 out of 10 at rest, 8 out of 10 with movement.  Patient is able to demonstrate good grip strength left hand.  Neurovascularly intact.  Obvious deformity involving left shoulder.  Review of Systems  Positive: Left shoulder injury.  ROM limited due to pain. Negative: Any other injury.  Numbness.  Physical Exam  BP 105/63 (BP Location: Right Arm)   Pulse 91   Temp 98.3 F (36.8 C) (Oral)   Resp 15   SpO2 100%  Gen:   Awake, no distress   Resp:  Normal effort  MSK:   Deformity noted to left shoulder suggestive of anterior dislocation.  Peripheral pulses intact.  Brisk capillary refill.  Good grip strength in his hands distally. Other:    Medical Decision Making  Medically screening exam initiated at 12:52 PM.  Appropriate orders placed.  Tramar Brueckner was informed that the remainder of the evaluation will be completed by another provider, this initial triage assessment does not replace that evaluation, and the importance of remaining in the ED until their evaluation is complete.  Plain films.  Will need reduction.  Percocet for pain control.  Averse to manipulation here in triage.    Lorelee New, PA-C 02/02/21 1258    Vanetta Mulders, MD 02/10/21 1349

## 2021-02-02 NOTE — ED Triage Notes (Signed)
Pt here with c/o left shoulder dislocation hx of same

## 2021-02-02 NOTE — Discharge Instructions (Addendum)
Wear immobilizer.  Follow-up with orthopedics, referral given. Take Motrin and Tylenol as needed as directed for pain.

## 2021-02-02 NOTE — ED Notes (Signed)
RT present for conscious sedation for left shoulder closed reduction, BVM, oral suction, ETCO2 monitoring in place an ready prior to procedure. Pt Respiratory status stable post reduction procedure w/no distress noted at this time. RT will continue to monitor.

## 2021-02-02 NOTE — ED Provider Notes (Signed)
[facility] EMERGENCY DEPARTMENT Provider Note   CSN: [id number] Arrival date & time: 02/02/21  1210     History No chief complaint on file.   Alexander Blankenship is a 20 y.o. male.  20 year old male with left shoulder pain, concern for dislocation. Patient states he was going to use his left arm to open a door today which his shoulder dislocated. Prior dislocation 2 years ago, did follow up with ortho and told there was nothing more to do at that time. Denies hand weakness/numbness, no other injuries.        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  Social History   Tobacco Use   Smoking status: Never   Smokeless tobacco: Never  Substance Use Topics   Alcohol use: No   Drug use: No    Home Medications Prior to Admission medications   Medication Sig Start Date End Date Taking? Authorizing Provider  amoxicillin-clavulanate (AUGMENTIN) 875-125 MG tablet Take 1 tablet by mouth every 12 (twelve) hours. 12/26/20   Gilda Crease, MD  FLUoxetine (PROZAC) 20 MG capsule Take 40 mg by mouth daily. 01/01/19   [provider]  oxyCODONE-acetaminophen (PERCOCET/ROXICET) 5-325 MG tablet Take 2 tablets by mouth every 4 (four) hours as needed for severe pain. 02/21/19   Tanda Rockers, PA-C    Allergies    Patient has no known allergies.  Review of Systems   Review of Systems  Constitutional:   Negative for fever.  Musculoskeletal:  Positive for arthralgias. Negative for neck pain and neck stiffness.  Skin:  Negative for rash and wound.  Allergic/Immunologic: Negative for immunocompromised state.  Neurological:  Negative for weakness and numbness.   Physical Exam Updated Vital Signs BP 114/64   Pulse 90   Temp 98.1 F (36.7 C) (Oral)   Resp 17   Wt 74.4 kg   SpO2 100%   BMI 26.47 kg/m   Physical Exam Vitals and nursing note reviewed.  Constitutional:      General: He is not in acute distress.    Appearance: He is well-developed. He is not diaphoretic.  HENT:     Head: Normocephalic and atraumatic.  Cardiovascular:     Pulses: Normal pulses.  Pulmonary:     Effort: Pulmonary effort is normal.  Musculoskeletal:        General: Tenderness and deformity present.     Comments: Palpable step off left humerus suggesting left shoulder dislocation. Strong radial pulse present, sensation intact, normal motor function left hand  Skin:    General: Skin is warm and dry.     Findings: No erythema or rash.  Neurological:     Mental Status: He is alert and oriented to person, place, and time.  Psychiatric:        Behavior: Behavior normal.    ED Results / Procedures / Treatments   Labs (all labs ordered are listed,  but only abnormal results are displayed) Labs Reviewed - No data to display  EKG None  Radiology DG Shoulder Left  Result Date: 02/02/2021 CLINICAL DATA:  Left shoulder dislocation EXAM: LEFT SHOULDER - 2+ VIEW COMPARISON:  None. FINDINGS: Anterior dislocation of the left glenohumeral joint. No evident fracture. The acromioclavicular joint is intact. The partially imaged left chest is unremarkable. IMPRESSION: Anterior dislocation of the left glenohumeral joint. No evident fracture. Electronically Signed   By: Lauralyn Primes M.D.   On: 02/02/2021 14:12   DG Shoulder Left Portable  Result Date: 02/02/2021 CLINICAL DATA:  20 year old male status post  reduction left shoulder dislocation EXAM: LEFT SHOULDER COMPARISON:  Earlier radiograph dated 02/02/2021. FINDINGS: Interval reduction of the previously seen left shoulder dislocation, now in anatomic alignment. Apparent crescentic density along the posterior glenoid on the lateral view may represent cortical region of the humeral tuberosity. A bony fracture from the glenoid rim is not excluded but less likely. The soft tissues are unremarkable. IMPRESSION: Successful reduction of the previously seen dislocated left shoulder. Electronically Signed   By: Elgie Collard M.D.   On: 02/02/2021 21:18    Procedures .Nerve Block  Date/Time: 02/02/2021 9:54 PM Performed by: Jeannie Fend, PA-C Authorized by: Jeannie Fend, PA-C   Consent:    Consent obtained:  Verbal   Consent given by:  Patient   Risks, benefits, and alternatives were discussed: yes     Risks discussed:  Swelling, unsuccessful block, nerve damage, pain and infection   Alternatives discussed:  No treatment Universal protocol:    Patient identity confirmed:  Verbally with patient Indications:    Indications:  Pain relief and procedural anesthesia Location:    Body area:  Upper extremity   Laterality:  Left Pre-procedure details:    Skin preparation:  Povidone-iodine   Preparation: Patient was prepped and draped in usual sterile fashion   Skin anesthesia:    Skin anesthesia method:  None Procedure details:    Block needle gauge:  22 G   Anesthetic injected:  Lidocaine 1% w/o epi   Steroid injected:  None   Additive injected:  None   Injection procedure:  Anatomic landmarks identified, negative aspiration for blood, introduced needle and anatomic landmarks palpated Post-procedure details:    Dressing:  Sterile dressing   Outcome:  Pain improved   Procedure completion:  Tolerated well, no immediate complications   Medications Ordered in ED Medications  propofol (DIPRIVAN) 10 mg/mL bolus/IV push 37.2 mg ( Intravenous  Canceled Entry 02/02/21 2103)  oxyCODONE-acetaminophen (PERCOCET/ROXICET) 5-325 MG per tablet 1 tablet (1 tablet Oral Given 02/02/21 1305)  fentaNYL (SUBLIMAZE) injection 100 mcg (100 mcg Intravenous Given 02/02/21 1831)  lidocaine (PF) (XYLOCAINE) 1 % injection 30 mL (30 mLs Other Given by Other 02/02/21 1831)  fentaNYL (SUBLIMAZE) injection 100 mcg (100 mcg Intravenous Given 02/02/21 1937)  propofol (DIPRIVAN) 10 mg/mL bolus/IV push (30 mg Intravenous Given 02/02/21 2059)    ED Course  I have reviewed the triage vital signs and the nursing notes.  Pertinent labs & imaging results that were available during my care of the patient were reviewed by me and considered in my medical decision making (see chart for details).  Clinical Course as of 02/02/21 2219  Wed Feb 02, 2021  [id number] 20 year old male with left shoulder dislocation as above.  Sensation intact to hand with strong radial pulse present and motor intact.  Attempted block with lidocaine with attempted reduction.  Pain did improve however unable to reduce.  Patient was sedated and reduced by Dr. Stevie Kern, see his procedure note for further detail.  Patient tolerated procedure well, post reduction x-ray with successful reduction.  Patient is placed in a sling and referred to Ortho for follow-up. [LM]    Clinical Course User Index [LM] Alden Hipp   MDM Rules/Calculators/A&P                           Final Clinical Impression(s) / ED Diagnoses Final diagnoses:  Anterior shoulder dislocation, left, initial encounter    Rx / DC Orders ED Discharge Orders     None        Alden Hipp 02/02/21 2219    Milagros Loll, MD 02/03/21 [phone]

## 2021-02-03 NOTE — ED Provider Notes (Signed)
.  Sedation  Date/Time: 02/03/2021 5:00 PM Performed by: Milagros Loll, MD Authorized by: Milagros Loll, MD   Consent:    Consent obtained:  Verbal and written   Consent given by:  Patient   Risks discussed:  Allergic reaction, prolonged hypoxia resulting in organ damage, dysrhythmia, prolonged sedation necessitating reversal, respiratory compromise necessitating ventilatory assistance and intubation, inadequate sedation, nausea and vomiting   Alternatives discussed:  Analgesia without sedation and anxiolysis Universal protocol:    Immediately prior to procedure, a time out was called: yes     Patient identity confirmed:  Arm band and verbally with patient Indications:    Procedure performed:  Dislocation reduction   Procedure necessitating sedation performed by:  Physician performing sedation Pre-sedation assessment:    Time since last food or drink:  Greater than 6 hours   ASA classification: class 1 - normal, healthy patient     Mallampati score:  I - soft palate, uvula, fauces, pillars visible   Pre-sedation assessments completed and reviewed: airway patency, cardiovascular function, hydration status, mental status, nausea/vomiting, pain level, respiratory function and temperature   Immediate pre-procedure details:    Reassessment: Patient reassessed immediately prior to procedure     Reviewed: vital signs     Verified: bag valve mask available, emergency equipment available, intubation equipment available, IV patency confirmed, oxygen available, reversal medications available and suction available   Procedure details (see MAR for exact dosages):    Preoxygenation:  Nasal cannula   Sedation:  Propofol   Intended level of sedation: deep   Intra-procedure monitoring:  Blood pressure monitoring, cardiac monitor, continuous pulse oximetry, continuous capnometry, frequent LOC assessments and frequent vital sign checks   Intra-procedure events: none     Total Provider sedation  time (minutes):  12 Post-procedure details:    Attendance: Constant attendance by certified staff until patient recovered     Recovery: Patient returned to pre-procedure baseline     Post-sedation assessments completed and reviewed: airway patency, cardiovascular function, hydration status, mental status, nausea/vomiting, pain level, respiratory function and temperature     Patient is stable for discharge or admission: yes     Procedure completion:  Tolerated well, no immediate complications Comments:         .Ortho Injury Treatment  Date/Time: 02/03/2021 5:01 PM Performed by: Milagros Loll, MD Authorized by: Milagros Loll, MD   Consent:    Consent obtained:  Verbal and written   Consent given by:  Patient   Risks discussed:  Recurrent dislocationInjury location: shoulder Location details: left shoulder Injury type: dislocation Dislocation type: anterior Hill-Sachs deformity: no Chronicity: recurrent Pre-procedure neurovascular assessment: neurovascularly intact Pre-procedure distal perfusion: normal Pre-procedure neurological function: normal Pre-procedure range of motion: reduced  Anesthesia: Local anesthesia used: yes (see PA procedure note)  Patient sedated: Yes. Refer to sedation procedure documentation for details of sedation. Manipulation performed: yes Reduction method: traction and counter traction Reduction successful: yes X-ray confirmed reduction: yes Immobilization: sling Post-procedure neurovascular assessment: post-procedure neurovascularly intact Post-procedure distal perfusion: normal Post-procedure neurological function: normal Post-procedure range of motion: normal      Milagros Loll, MD 02/03/21 [phone]

## 2022-04-15 ENCOUNTER — Emergency Department (HOSPITAL_COMMUNITY)
Admission: EM | Admit: 2022-04-15 | Discharge: 2022-04-15 | Disposition: A | Discharge status: Home / Self Care | Payer: Medicaid Other | Attending: Emergency Medicine | Admitting: Emergency Medicine

## 2022-04-15 ENCOUNTER — Encounter (HOSPITAL_COMMUNITY): Payer: Self-pay | Admitting: Emergency Medicine

## 2022-04-15 ENCOUNTER — Emergency Department (HOSPITAL_COMMUNITY): Payer: Medicaid Other

## 2022-04-15 DIAGNOSIS — S43015A Anterior dislocation of left humerus, initial encounter: Secondary | ICD-10-CM | POA: Insufficient documentation

## 2022-04-15 DIAGNOSIS — X500XXA Overexertion from strenuous movement or load, initial encounter: Secondary | ICD-10-CM | POA: Insufficient documentation

## 2022-04-15 DIAGNOSIS — S4992XA Unspecified injury of left shoulder and upper arm, initial encounter: Secondary | ICD-10-CM | POA: Diagnosis present

## 2022-04-15 DIAGNOSIS — Y9371 Activity, boxing: Secondary | ICD-10-CM | POA: Diagnosis not present

## 2022-04-15 DIAGNOSIS — S43005A Unspecified dislocation of left shoulder joint, initial encounter: Secondary | ICD-10-CM

## 2022-04-15 MED ORDER — OXYCODONE-ACETAMINOPHEN 5-325 MG PO TABS
1.0000 | ORAL_TABLET | ORAL | Status: DC | PRN
  Administered 2022-04-15: 1 via ORAL
  Filled 2022-04-15: qty 1

## 2022-04-15 MED ORDER — FENTANYL CITRATE PF 50 MCG/ML IJ SOSY
50.0000 ug | PREFILLED_SYRINGE | Freq: Once | INTRAMUSCULAR | Status: AC
  Administered 2022-04-15: 50 ug via INTRAVENOUS
  Filled 2022-04-15: qty 1

## 2022-04-15 MED ORDER — LIDOCAINE HCL (PF) 1 % IJ SOLN
30.0000 mL | Freq: Once | INTRAMUSCULAR | Status: DC
  Filled 2022-04-15: qty 30

## 2022-04-15 NOTE — ED Triage Notes (Signed)
Pt was boxing and felt his left shoulder pop out of place. Hx of shoulder dislocation.
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
## 2022-04-15 NOTE — Discharge Instructions (Addendum)
You have been seen today for your complaint of left shoulder dislocation. Your imaging showed a left shoulder dislocation.  We reduced this while you are here. Home care instructions are as follows:  You should keep the left arm in the sling and immobilizer until you follow-up with Ortho..  You should avoid heavy lifting or overhead movements.  You may take Tylenol and ibuprofen for pain.  You should follow dosing instructions on the bottle.  You may alternate heat and ice for pain as well. Follow up with: Orthopedics Dr. Blanchie Dessert, you should call the number on this paper to schedule an appointment for an ED follow-up. Please seek immediate medical care if you develop any of the following symptoms: Your pain gets worse rather than better. You lose feeling in your arm or hand. Your arm or hand becomes white and cold. At this time there does not appear to be the presence of an emergent medical condition, however there is always the potential for conditions to change. Please read and follow the below instructions.  Do not take your medicine if  develop an itchy rash, swelling in your mouth or lips, or difficulty breathing; call 911 and seek immediate emergency medical attention if this occurs.  You may review your lab tests and imaging results in their entirety on your MyChart account.  Please discuss all results of fully with your primary care provider and other specialist at your follow-up visit.  Note: Portions of this text may have been transcribed using voice recognition software. Every effort was made to ensure accuracy; however, inadvertent computerized transcription errors may still be present.

## 2023-12-25 ENCOUNTER — Other Ambulatory Visit: Payer: Self-pay

## 2023-12-25 ENCOUNTER — Emergency Department (HOSPITAL_BASED_OUTPATIENT_CLINIC_OR_DEPARTMENT_OTHER): Admitting: Radiology

## 2023-12-25 ENCOUNTER — Emergency Department (HOSPITAL_BASED_OUTPATIENT_CLINIC_OR_DEPARTMENT_OTHER)
Admission: EM | Admit: 2023-12-25 | Discharge: 2023-12-25 | Disposition: A | Discharge status: Home / Self Care | Payer: Worker's Compensation | Attending: Emergency Medicine | Admitting: Emergency Medicine

## 2023-12-25 DIAGNOSIS — W010XXA Fall on same level from slipping, tripping and stumbling without subsequent striking against object, initial encounter: Secondary | ICD-10-CM | POA: Insufficient documentation

## 2023-12-25 DIAGNOSIS — M25511 Pain in right shoulder: Secondary | ICD-10-CM | POA: Diagnosis present

## 2023-12-25 DIAGNOSIS — Y990 Civilian activity done for income or pay: Secondary | ICD-10-CM | POA: Insufficient documentation

## 2023-12-25 DIAGNOSIS — M24412 Recurrent dislocation, left shoulder: Secondary | ICD-10-CM | POA: Diagnosis not present

## 2023-12-25 MED ORDER — FENTANYL CITRATE PF 50 MCG/ML IJ SOSY
75.0000 ug | PREFILLED_SYRINGE | Freq: Once | INTRAMUSCULAR | Status: AC
  Administered 2023-12-25: 75 ug via INTRAVENOUS
  Filled 2023-12-25: qty 2

## 2023-12-25 MED ORDER — NAPROXEN 500 MG PO TABS: 500.0000 mg | ORAL_TABLET | Freq: Two times a day (BID) | ORAL | 0 refills | Status: AC

## 2023-12-25 MED ORDER — KETOROLAC TROMETHAMINE 15 MG/ML IJ SOLN
15.0000 mg | Freq: Once | INTRAMUSCULAR | Status: AC
  Administered 2023-12-25: 15 mg via INTRAVENOUS
  Filled 2023-12-25: qty 1

## 2023-12-25 MED ORDER — NAPROXEN 500 MG PO TABS: 500.0000 mg | ORAL_TABLET | Freq: Two times a day (BID) | ORAL | 0 refills | Status: DC

## 2023-12-25 NOTE — ED Provider Notes (Signed)
  EMERGENCY DEPARTMENT AT [facility] Provider Note   CSN: [id number] Arrival date & time: 12/25/23  1625     History  Chief Complaint  Patient presents with   Dislocation    Alexander Blankenship is a 23 y.o. male.  He denies any PMH.  Presents to ER for right shoulder dislocation.  He states has had this twice in the past this is similar.  He states he was going down the stairs and slipped, grabbed the railing for support and felt his shoulder "pop out". He denies numbness, tingling.  He does report limited range of motion and pain rated a 5 out of 10 when sitting still onset 10 with attempted movement.  HPI     Home Medications Prior to Admission medications   Medication Sig Start Date End Date Taking? Authorizing Provider  amoxicillin -clavulanate (AUGMENTIN ) 875-125 MG tablet Take 1 tablet by mouth every 12 (twelve) hours. 12/26/20   Ballard Bongo, MD  FLUoxetine (PROZAC) 20 MG capsule Take 40 mg by mouth daily. 01/01/19   [provider]  oxyCODONE -acetaminophen  (PERCOCET/ROXICET) 5-325 MG tablet Take 2 tablets by mouth every 4 (four) hours as needed for severe pain. 02/21/19   Venter, Margaux, PA-C      Allergies    Patient has no known allergies.    Review of Systems   Review of Systems  Physical Exam Updated Vital Signs BP 121/83   Pulse 69   Temp 99 F (37.2 C) (Oral)   Resp 11   Wt 71.7 kg   SpO2 100%   BMI 25.50 kg/m  Physical Exam Vitals and nursing note reviewed.  Constitutional:      General: He is not in acute distress.    Appearance: He is well-developed.  HENT:     Head: Normocephalic and atraumatic.  Eyes:     Conjunctiva/sclera: Conjunctivae normal.  Cardiovascular:     Rate and Rhythm: Normal rate and regular rhythm.     Heart sounds: No murmur heard. Pulmonary:     Effort: Pulmonary effort is normal. No respiratory distress.     Breath sounds: Normal breath sounds.  Abdominal:     Palpations: Abdomen is  soft.     Tenderness: There is no abdominal tenderness.  Musculoskeletal:        General: No swelling.     Cervical back: Neck supple.     Comments: Step-off noted left shoulder consistent with anterior dislocation.  Normal sensation over deltoid and remainder of left arm.  Pulse intact in left radial artery.  Normal range of motion of elbow wrist and shoulder on the left.  Skin:    General: Skin is warm and dry.     Capillary Refill: Capillary refill takes less than 2 seconds.  Neurological:     Mental Status: He is alert.  Psychiatric:        Mood and Affect: Mood normal.     ED Results / Procedures / Treatments   Labs (all labs ordered are listed, but only abnormal results are displayed) Labs Reviewed - No data to display  EKG None  Radiology DG Shoulder Left Result Date: 12/25/2023 CLINICAL DATA:  Status post reduction of an anterior left shoulder dislocation. EXAM: LEFT SHOULDER - 2+ VIEW COMPARISON:  Earlier today. FINDINGS: Single oblique Y-view of the left shoulder demonstrates reduction of the previously seen anterior dislocation. No visible fracture on this single view. IMPRESSION: Reduction of the previously seen anterior dislocation. Electronically Signed   By: Landon Pinion  Hansel Ley M.D.   On: 12/25/2023 18:19   DG Shoulder Left Result Date: 12/25/2023 CLINICAL DATA:  Left shoulder pain.  Dislocation. EXAM: LEFT SHOULDER - 2+ VIEW COMPARISON:  No shoulder radiograph dated 04/15/2022. FINDINGS: Anteriorly dislocated left shoulder. No acute fracture. The bones are well mineralized. The soft tissues are unremarkable. IMPRESSION: Anteriorly dislocated left shoulder. Electronically Signed   By: Angus Bark M.D.   On: 12/25/2023 17:04    Procedures .Reduction of dislocation  Date/Time: 12/25/2023 6:19 PM  Performed by: Aimee Houseman, PA-C Authorized by: Aimee Houseman, PA-C  Consent: Verbal consent obtained. Written consent obtained. Risks and benefits: risks, benefits  and alternatives were discussed Consent given by: patient Patient understanding: patient states understanding of the procedure being performed Patient consent: the patient's understanding of the procedure matches consent given Procedure consent: procedure consent matches procedure scheduled Test results: test results available and properly labeled Imaging studies: imaging studies available Patient identity confirmed: verbally with patient Time out: Immediately prior to procedure a "time out" was called to verify the correct patient, procedure, equipment, support staff and site/side marked as required. Local anesthesia used: no  Anesthesia: Local anesthesia used: no  Sedation: Patient sedated: no  Patient tolerance: patient tolerated the procedure well with no immediate complications Comments: Reduction of left anterior shoulder dislocation using scapular manipulation with Dr. Lewis Red.       Medications Ordered in ED Medications  fentaNYL  (SUBLIMAZE ) injection 75 mcg (75 mcg Intravenous Given 12/25/23 1734)  ketorolac  (TORADOL ) 15 MG/ML injection 15 mg (15 mg Intravenous Given 12/25/23 1804)    ED Course/ Medical Decision Making/ A&P                                 Medical Decision Making This patient presents to the ED for concern of left shoulder dislocation, this involves an extensive number of treatment options, and is a complaint that carries with it a high risk of complications and morbidity.  The differential diagnosis includes fracture, dislocation, neurovascular compromise, other   Co morbidities that complicate the patient evaluation :   History of shoulder dislocation, left   Additional history obtained:  Additional history obtained from EMR  External records from outside source obtained and reviewed including prior notes     Imaging Studies ordered:  I ordered imaging studies including left shoulder x-ray which shows anterior shoulder dislocation I  independently visualized and interpreted imaging within scope of identifying emergent findings  I agree with the radiologist interpretation  Postreduction left shoulder ordered and interpreted by me shows reduction of left shoulder    Problem List / ED Course / Critical interventions / Medication management  Left shoulder pain-patient here for left shoulder dislocation.  Happened when grabbing a railing to prevent nausea from falling, he had no other injuries or trauma, no findings of any neurovascular compromise on exam.  He had an obvious dislocation clinically and noted dislocation on his x-ray.  He was agreeable with trial of reduction with pain control without sedation.  We were able to successfully reduce his left shoulder dislocation using scapular manipulation technique.  He was given fentanyl  prior to the procedure with some relief of this pain, given Toradol  after procedure, having some mild discomfort but overall feeling much better. Repeat exam after reduction shows normal sensation, normal pulses and normal function of the hand.  Patient noted follow-up with orthopedics, advised on supportive care follow-up and return precautions.  He is instructed to keep his arm in a sling. I have reviewed the patients home medicines and have made adjustments as needed   Social Determinants of Health:  Patient works for [employer]      Amount and/or Complexity of Data Reviewed Radiology: ordered.  Risk Prescription drug management.           Final Clinical Impression(s) / ED Diagnoses Final diagnoses:  Recurrent dislocation, left shoulder    Rx / DC Orders ED Discharge Orders     None         Aimee Houseman, PA-C 12/25/23 1837    Deatra Face, MD 12/27/23 1352

## 2023-12-25 NOTE — ED Triage Notes (Signed)
 [employer]. Working out. Tripped on steps landing on right shoulder. Possibly dislocated- hx dislocation 3 times prior.

## 2023-12-25 NOTE — Discharge Instructions (Signed)
 Is a pleasure taking care of you today.  Your evaluated in the ER for a dislocation of your left shoulder.  We were able to reduce this in the emergency department.  I am prescribing you naproxen to use as needed for pain.  Make sure you take this with food.  He can also take over-the-counter Tylenol  as needed for discomfort.  Keep the shoulder in a sling.  You can squeeze a ball or flex and extend your fingers of your hand to prevent stiffness in your hand and wrist.  Do not raise your arm above your head, or try to lift, push or pull with the left arm.  Follow-up closely with orthopedics.  Come back to the ER for new or worsening symptoms.
